# Patient Record
Sex: Male | Born: 2005 | Race: Black or African American | Hispanic: No | Marital: Single | Smoking: Never smoker
Health system: Southern US, Community
[De-identification: ages and names within clinical notes are randomized; demographics above are authoritative.]

## PROBLEM LIST (undated history)

## (undated) HISTORY — PX: CIRCUMCISION: SUR203

## (undated) HISTORY — PX: OTHER SURGICAL HISTORY: SHX169

---
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
## 2024-01-25 ENCOUNTER — Emergency Department (HOSPITAL_COMMUNITY)
Admission: EM | Admit: 2024-01-25 | Discharge: 2024-01-26 | Disposition: A | Discharge status: Home / Self Care | Attending: Emergency Medicine | Admitting: Emergency Medicine

## 2024-01-25 ENCOUNTER — Other Ambulatory Visit: Payer: Self-pay

## 2024-01-25 ENCOUNTER — Encounter (HOSPITAL_COMMUNITY): Payer: Self-pay | Admitting: *Deleted

## 2024-01-25 DIAGNOSIS — R059 Cough, unspecified: Secondary | ICD-10-CM | POA: Diagnosis present

## 2024-01-25 DIAGNOSIS — J069 Acute upper respiratory infection, unspecified: Secondary | ICD-10-CM | POA: Insufficient documentation

## 2024-01-25 LAB — RESP PANEL BY RT-PCR (RSV, FLU A&B, COVID)  RVPGX2
Influenza A by PCR: NEGATIVE
Influenza B by PCR: NEGATIVE
Resp Syncytial Virus by PCR: NEGATIVE
SARS Coronavirus 2 by RT PCR: NEGATIVE

## 2024-01-25 LAB — GROUP A STREP BY PCR: Group A Strep by PCR: NOT DETECTED

## 2024-01-25 NOTE — ED Provider Triage Note (Signed)
 Emergency Medicine Provider Triage Evaluation Note  Travis Baker , a 18 y.o. male  was evaluated in triage.  Pt complains of URI symptoms.  Review of Systems  Positive: Body aches, cough, congestion, rhinorrhea, sore throat Negative: Nausea, vomiting, chest pain, shortness of breath, abdominal pain, diarrhea  Physical Exam  There were no vitals taken for this visit. Gen:   Awake, no distress   Resp:  Normal effort  MSK:   Moves extremities without difficulty  Other:  Erythematous oropharynx without exudate or PTA  Medical Decision Making  Medically screening exam initiated at 9:37 PM.  Appropriate orders placed.  Ido Hoehn was informed that the remainder of the evaluation will be completed by another provider, this initial triage assessment does not replace that evaluation, and the importance of remaining in the ED until their evaluation is complete.  Labs ordered   Kirstin Kugler N, PA-C 01/25/24 2138

## 2024-01-25 NOTE — ED Triage Notes (Signed)
 The pt has ben coughing since earlier tonight at work no temp

## 2024-01-26 ENCOUNTER — Emergency Department (HOSPITAL_COMMUNITY)

## 2024-01-26 MED ORDER — BENZONATATE 100 MG PO CAPS: 100.0000 mg | ORAL_CAPSULE | Freq: Three times a day (TID) | ORAL | 0 refills | Status: AC

## 2024-01-26 MED ORDER — IBUPROFEN 800 MG PO TABS: 800.0000 mg | ORAL_TABLET | Freq: Three times a day (TID) | ORAL | 0 refills | Status: AC

## 2024-01-26 NOTE — ED Provider Notes (Signed)
 [place] [facility] AT [facility] Provider Note   CSN: [id number] Arrival date & time: 01/25/24  2129     History  Chief Complaint  Patient presents with   Cough    Travis Baker is a 18 y.o. male.  The history is provided by the patient and medical records.  Cough Associated symptoms: chills, myalgias and sore throat    17 y.o. M here with cough, nasal congestion, body aches, chills, and generally feeling unwell.  This has been ongoing since about Sunday night/Monday morning, 4 days now.  He denies sick contacts.  He was sent home from work today due to his symptoms.  States chest is started to hurt a bit with coughing, some production of mucous.  Tried Nyquil PTA without relief.  Home Medications Prior to Admission medications   Medication Sig Start Date End Date Taking? Authorizing Provider  cetirizine  (ZYRTEC  ALLERGY) 10 MG tablet Take 1 tablet (10 mg total) by mouth daily. 06/04/23   Rising, Ivette Marks, PA-C  guaiFENesin  (ROBITUSSIN) 100 MG/5ML liquid Take 10 mLs by mouth every 4 (four) hours as needed for cough or to loosen phlegm. 06/04/23   Rising, Ivette Marks, PA-C  hydrocortisone  cream 1 % Apply to affected area 2 times daily 01/14/23   Townsend Freud, MD      Allergies    Patient has no known allergies.    Review of Systems   Review of Systems  Constitutional:  Positive for chills.  HENT:  Positive for congestion and sore throat.   Respiratory:  Positive for cough.   Musculoskeletal:  Positive for myalgias.  All other systems reviewed and are negative.   Physical Exam Updated Vital Signs BP 120/78 (BP Location: Right Arm)   Pulse 92   Temp 99.3 F (37.4 C)   Resp 16   Ht 5\' 9"  (1.753 m)   Wt 54.9 kg   SpO2 99%   BMI 17.87 kg/m   Physical Exam Vitals and nursing note reviewed.  Constitutional:      Appearance: He is well-developed.  HENT:     Head: Normocephalic and atraumatic.     Mouth/Throat:     Comments: Tonsils overall normal in  appearance bilaterally without exudate; uvula midline without evidence of peritonsillar abscess; handling secretions appropriately; no difficulty swallowing or speaking; normal phonation without stridor Eyes:     Conjunctiva/sclera: Conjunctivae normal.     Pupils: Pupils are equal, round, and reactive to light.  Cardiovascular:     Rate and Rhythm: Normal rate and regular rhythm.     Heart sounds: Normal heart sounds.  Pulmonary:     Effort: Pulmonary effort is normal.     Breath sounds: Normal breath sounds.  Abdominal:     General: Bowel sounds are normal.     Palpations: Abdomen is soft.  Musculoskeletal:        General: Normal range of motion.     Cervical back: Normal range of motion.  Skin:    General: Skin is warm and dry.  Neurological:     Mental Status: He is alert and oriented to person, place, and time.     ED Results / Procedures / Treatments   Labs (all labs ordered are listed, but only abnormal results are displayed) Labs Reviewed  RESP PANEL BY RT-PCR (RSV, FLU A&B, COVID)  [id number]  GROUP A STREP BY PCR    EKG None  Radiology DG Chest 2 View Result Date: 01/26/2024 CLINICAL DATA:  Cough EXAM: CHEST -  2 VIEW COMPARISON:  None Available. FINDINGS: The heart size and mediastinal contours are within normal limits. Both lungs are clear. The visualized skeletal structures are unremarkable. IMPRESSION: No active cardiopulmonary disease. Electronically Signed   By: Worthy Heads M.D.   On: 01/26/2024 02:56    Procedures Procedures    Medications Ordered in ED Medications - No data to display  ED Course/ Medical Decision Making/ A&P                                 Medical Decision Making Amount and/or Complexity of Data Reviewed Radiology: ordered and independent interpretation performed. ECG/medicine tests: ordered and independent interpretation performed.  Risk Prescription drug management.   18 year old male here with 4 days of URI type symptoms.   He notes nasal congestion, cough, body aches, and chills.  He is afebrile and nontoxic in appearance here.  His lungs are clear without any noted wheezes or rhonchi.  Does have wet cough noted on exam.  Does have some erythema of the posterior oropharynx but no noted tonsillar exudates.  Uvula remains midline.  He is handling secretions well, tolerating oral fluids without difficulty.  Strep screen and RVP are negative.  Chest x-ray without any acute infiltrates.  Suspect this is likely viral in etiology.  Plan to discharge home with symptomatic care and close PCP follow-up.  Can return here for any new or acute changes.  Final Clinical Impression(s) / ED Diagnoses Final diagnoses:  Viral URI with cough    Rx / DC Orders ED Discharge Orders          Ordered    benzonatate (TESSALON) 100 MG capsule  Every 8 hours        01/26/24 0305    ibuprofen  (ADVIL ) 800 MG tablet  3 times daily        01/26/24 0305              Coretha Dew, PA-C 01/26/24 0308    Mesner, Reymundo Caulk, MD 01/26/24 [phone]

## 2024-01-26 NOTE — Discharge Instructions (Signed)
Take the prescribed medication as directed.  Make sure to rest and hydrate well. Follow-up with your primary care doctor. Return to the ED for new or worsening symptoms.

## 2024-05-07 ENCOUNTER — Other Ambulatory Visit: Payer: Self-pay

## 2024-05-07 ENCOUNTER — Encounter (HOSPITAL_COMMUNITY): Payer: Self-pay

## 2024-05-07 ENCOUNTER — Emergency Department (HOSPITAL_COMMUNITY)
Admission: EM | Admit: 2024-05-07 | Discharge: 2024-05-07 | Discharge status: Against Medical Advice | Attending: Emergency Medicine | Admitting: Emergency Medicine

## 2024-05-07 DIAGNOSIS — H5789 Other specified disorders of eye and adnexa: Secondary | ICD-10-CM | POA: Diagnosis present

## 2024-05-07 DIAGNOSIS — Z5321 Procedure and treatment not carried out due to patient leaving prior to being seen by health care provider: Secondary | ICD-10-CM | POA: Diagnosis not present

## 2024-05-07 DIAGNOSIS — H538 Other visual disturbances: Secondary | ICD-10-CM | POA: Insufficient documentation

## 2024-05-07 NOTE — ED Notes (Signed)
 Pt name called to be roomed for treatment and no answer. Registration also said they called name and no answer.

## 2024-05-07 NOTE — ED Provider Triage Note (Signed)
 Emergency Medicine Provider Triage Evaluation Note  Amore Grater , a 18 y.o. male  was evaluated in triage.  Pt complains of eye pain. Report noticing pain and swelling around his R orbital region ongoing x 1 week.  Report blurry vision and pain with eye movement.  Also notice drainage from R eye.  Pt wear glasses.  He has 3 young children at home, none are in day care  Review of Systems  Positive: As above Negative: As above  Physical Exam  BP 137/77 (BP Location: Right Arm)   Pulse (!) 50   Temp (!) 97.4 F (36.3 C)   Resp 14   SpO2 100%  Gen:   Awake, no distress   Resp:  Normal effort  MSK:   Moves extremities without difficulty  Other:  Edema to R orbital region  Medical Decision Making  Medically screening exam initiated at 3:48 PM.  Appropriate orders placed.  Rogers Woodham was informed that the remainder of the evaluation will be completed by another provider, this initial triage assessment does not replace that evaluation, and the importance of remaining in the ED until their evaluation is complete.  Suspect preseptal cellulitis vs. Blepharitis.  Doubt orbital cellulitis   Nivia Colon, PA-C 05/07/24 1553

## 2024-05-07 NOTE — ED Notes (Signed)
 Waiting for pt from lobby

## 2024-05-07 NOTE — ED Triage Notes (Addendum)
 PT here for swelling to right eye x 1 week progressively getting worse over the week.  Swelling noted to eyelid and below the eye.  NO known injury or foreign body.  Endorses 7/10 pain.

## 2024-05-11 ENCOUNTER — Other Ambulatory Visit: Payer: Self-pay

## 2024-05-11 ENCOUNTER — Encounter (HOSPITAL_COMMUNITY): Payer: Self-pay | Admitting: *Deleted

## 2024-05-11 ENCOUNTER — Ambulatory Visit (HOSPITAL_COMMUNITY)
Admission: EM | Admit: 2024-05-11 | Discharge: 2024-05-11 | Disposition: A | Discharge status: Home / Self Care | Attending: Family Medicine | Admitting: Family Medicine

## 2024-05-11 DIAGNOSIS — H00036 Abscess of eyelid left eye, unspecified eyelid: Secondary | ICD-10-CM | POA: Diagnosis not present

## 2024-05-11 DIAGNOSIS — H0011 Chalazion right upper eyelid: Secondary | ICD-10-CM

## 2024-05-11 MED ORDER — AMOXICILLIN-POT CLAVULANATE 875-125 MG PO TABS
1.0000 | ORAL_TABLET | Freq: Two times a day (BID) | ORAL | 0 refills | Status: AC
Start: 2024-05-11 — End: ?

## 2024-05-11 MED ORDER — ERYTHROMYCIN 5 MG/GM OP OINT
TOPICAL_OINTMENT | OPHTHALMIC | 0 refills | Status: AC
Start: 2024-05-11 — End: ?

## 2024-05-11 NOTE — ED Provider Notes (Signed)
 [facility]    CSN: [id number] Arrival date & time: 05/11/24  1156      History   Chief Complaint Chief Complaint  Patient presents with   Eye Swelling   Blurred Vision    HPI Travis Baker is a 18 y.o. male presents for eyelid swelling.  Patient reports 1 to 2 weeks of bilateral upper eyelid swelling with drainage.  States left has become more red and swollen than the right.  Denies any eye redness but does report some visual blurriness secondary to the drainage from the eyelid.  No fevers or chills.  He has had a runny nose and mild cough as well.  He has been doing warm compresses to the areas.  He wears glasses only.  No other concerns at this time.  HPI  History reviewed. No pertinent past medical history.  There are no active problems to display for this patient.   Past Surgical History:  Procedure Laterality Date   CIRCUMCISION     circumsion N/A    5 years ago       Home Medications    Prior to Admission medications   Medication Sig Start Date End Date Taking? Authorizing Provider  amoxicillin -clavulanate (AUGMENTIN ) 875-125 MG tablet Take 1 tablet by mouth every 12 (twelve) hours. 05/11/24  Yes Loreda Myla SAUNDERS, NP  erythromycin  ophthalmic ointment Place a 1/2 inch ribbon of ointment onto the right and left upper eye lids 3 times a day for 7 days. 05/11/24  Yes Kearsten Ginther, Jodi R, NP  benzonatate  (TESSALON ) 100 MG capsule Take 1 capsule (100 mg total) by mouth every 8 (eight) hours. 01/26/24   Jarold Olam HERO, PA-C  cetirizine  (ZYRTEC  ALLERGY) 10 MG tablet Take 1 tablet (10 mg total) by mouth daily. 06/04/23   Rising, Asberry, PA-C  guaiFENesin  (ROBITUSSIN) 100 MG/5ML liquid Take 10 mLs by mouth every 4 (four) hours as needed for cough or to loosen phlegm. 06/04/23   Rising, Asberry, PA-C  hydrocortisone  cream 1 % Apply to affected area 2 times daily 01/14/23   Willaim Darnel, MD  ibuprofen  (ADVIL ) 800 MG tablet Take 1 tablet (800 mg total) by mouth 3 (three) times  daily. 01/26/24   Jarold Olam HERO, PA-C    Family History History reviewed. No pertinent family history.  Social History Social History   Tobacco Use   Smoking status: Never   Smokeless tobacco: Never  Vaping Use   Vaping status: Some Days   Start date: 10/17/2019   Substances: Nicotine, Flavoring  Substance Use Topics   Alcohol use: No   Drug use: Not Currently    Types: Marijuana     Allergies   Patient has no known allergies.   Review of Systems Review of Systems  HENT:         Bilateral upper eyelid swelling     Physical Exam Triage Vital Signs ED Triage Vitals  Encounter Vitals Group     BP 05/11/24 1232 110/69     Girls Systolic BP Percentile --      Girls Diastolic BP Percentile --      Boys Systolic BP Percentile --      Boys Diastolic BP Percentile --      Pulse Rate 05/11/24 1232 74     Resp 05/11/24 1232 16     Temp 05/11/24 1232 99 F (37.2 C)     Temp Source 05/11/24 1232 Oral     SpO2 05/11/24 1232 96 %  Weight --      Height --      Head Circumference --      Peak Flow --      Pain Score 05/11/24 1233 10     Pain Loc --      Pain Education --      Exclude from Growth Chart --    No data found.  Updated Vital Signs BP 110/69   Pulse 74   Temp 99 F (37.2 C) (Oral)   Resp 16   SpO2 96%   Visual Acuity Right Eye Distance: 20/50 with glasses Left Eye Distance: 20/40 with glasses Bilateral Distance: 20/40 with glasses  Right Eye Near:   Left Eye Near:    Bilateral Near:     Physical Exam Vitals and nursing note reviewed.  Constitutional:      General: He is not in acute distress.    Appearance: Normal appearance. He is not ill-appearing.  HENT:     Head: Normocephalic and atraumatic.  Eyes:     Conjunctiva/sclera: Conjunctivae normal.     Right eye: Right conjunctiva is not injected.     Left eye: Left conjunctiva is not injected.     Pupils: Pupils are equal, round, and reactive to light.      Comments: There is a  lesion on to the right upper outer eyelid without erythema or warmth.  Minimal swelling.  No active drainage.  There is erythema and warmth of the left upper eyelid with purulent drainage.  Cardiovascular:     Rate and Rhythm: Normal rate.  Pulmonary:     Effort: Pulmonary effort is normal.  Skin:    General: Skin is warm and dry.  Neurological:     General: No focal deficit present.     Mental Status: He is alert and oriented to person, place, and time.  Psychiatric:        Mood and Affect: Mood normal.        Behavior: Behavior normal.      UC Treatments / Results  Labs (all labs ordered are listed, but only abnormal results are displayed) Labs Reviewed - No data to display  EKG   Radiology No results found.  Procedures Procedures (including critical care time)  Medications Ordered in UC Medications - No data to display  Initial Impression / Assessment and Plan / UC Course  I have reviewed the triage vital signs and the nursing notes.  Pertinent labs & imaging results that were available during my care of the patient were reviewed by me and considered in my medical decision making (see chart for details).     Reviewed exam and symptoms with patient.  Will start erythromycin  ointment bilaterally as well as Augmentin .  Will have him follow-up with ophthalmology as soon as he can for reevaluation and further treatment if needed.  Strict ER precautions reviewed and patient verbalized understanding. Final Clinical Impressions(s) / UC Diagnoses   Final diagnoses:  Chalazion of right upper eyelid  Eyelid cellulitis, left     Discharge Instructions      Start the antibiotic ointment 3 times a day for 7 days to both eyelids.  Start Augmentin  twice daily for 7 days as well.  Continue warm compresses.  Please follow-up with an ophthalmologist for recheck.  Please go to the ER if you develop any worsening symptoms.  Hope you feel better soon!    ED Prescriptions      Medication Sig Dispense Auth. Provider  erythromycin  ophthalmic ointment Place a 1/2 inch ribbon of ointment onto the right and left upper eye lids 3 times a day for 7 days. 3.5 g Jesslyn Viglione, Jodi R, NP   amoxicillin -clavulanate (AUGMENTIN ) 875-125 MG tablet Take 1 tablet by mouth every 12 (twelve) hours. 14 tablet Kartel Wolbert, Jodi R, NP      PDMP not reviewed this encounter.   Loreda Myla SAUNDERS, NP 05/11/24 1247

## 2024-05-11 NOTE — ED Triage Notes (Signed)
 C/O starting with right upper eyelid swelling approx 1-2 wks ago; reports drainage from eye and crusting. 2 days ago started with swelling and redness to left eyelid as well. C/O blurred vision in bilat eyes. Has been applying warm compresses. C/O rhinorrhea and tactile fever.

## 2024-05-11 NOTE — Discharge Instructions (Signed)
 Start the antibiotic ointment 3 times a day for 7 days to both eyelids.  Start Augmentin  twice daily for 7 days as well.  Continue warm compresses.  Please follow-up with an ophthalmologist for recheck.  Please go to the ER if you develop any worsening symptoms.  Hope you feel better soon!

## 2024-09-07 ENCOUNTER — Encounter (HOSPITAL_COMMUNITY): Payer: Self-pay

## 2024-09-07 ENCOUNTER — Emergency Department (HOSPITAL_COMMUNITY)
Admission: EM | Admit: 2024-09-07 | Discharge: 2024-09-07 | Disposition: A | Discharge status: Home / Self Care | Attending: Emergency Medicine | Admitting: Emergency Medicine

## 2024-09-07 ENCOUNTER — Emergency Department (HOSPITAL_COMMUNITY)

## 2024-09-07 ENCOUNTER — Other Ambulatory Visit: Payer: Self-pay

## 2024-09-07 DIAGNOSIS — M79642 Pain in left hand: Secondary | ICD-10-CM | POA: Diagnosis present

## 2024-09-07 DIAGNOSIS — S6991XA Unspecified injury of right wrist, hand and finger(s), initial encounter: Secondary | ICD-10-CM

## 2024-09-07 DIAGNOSIS — S62667A Nondisplaced fracture of distal phalanx of left little finger, initial encounter for closed fracture: Secondary | ICD-10-CM | POA: Diagnosis not present

## 2024-09-07 NOTE — Discharge Instructions (Signed)
 Follow-up with your primary care doctor.  Follow-up with the hand specialist.  Take ibuprofen  600 mg 3 times a day for the next 5 days.  Apply ice to the affected area.  Your x-ray showed you have a fracture at the distal end of your left pinky finger.  Otherwise no fracture.

## 2024-09-07 NOTE — ED Triage Notes (Signed)
 Pt gives verbal consent for mse
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
